# Patient Record
Sex: Female | Born: 1965 | Race: White | Hispanic: No | Marital: Single | Smoking: Never smoker
Health system: Southern US, Community
[De-identification: ages and names within clinical notes are randomized; demographics above are authoritative.]

## PROBLEM LIST (undated history)

## (undated) DIAGNOSIS — I10 Essential (primary) hypertension: Secondary | ICD-10-CM

## (undated) DIAGNOSIS — F419 Anxiety disorder, unspecified: Secondary | ICD-10-CM

## (undated) DIAGNOSIS — E785 Hyperlipidemia, unspecified: Secondary | ICD-10-CM

---

## 2010-05-16 ENCOUNTER — Emergency Department (HOSPITAL_BASED_OUTPATIENT_CLINIC_OR_DEPARTMENT_OTHER)
Admission: EM | Admit: 2010-05-16 | Discharge: 2010-05-16 | Disposition: A | Discharge status: Home / Self Care | Payer: Managed Care, Other (non HMO) | Attending: Emergency Medicine | Admitting: Emergency Medicine

## 2010-05-16 ENCOUNTER — Emergency Department (INDEPENDENT_AMBULATORY_CARE_PROVIDER_SITE_OTHER): Payer: Managed Care, Other (non HMO)

## 2010-05-16 DIAGNOSIS — M7989 Other specified soft tissue disorders: Secondary | ICD-10-CM | POA: Insufficient documentation

## 2010-05-16 DIAGNOSIS — M79609 Pain in unspecified limb: Secondary | ICD-10-CM | POA: Insufficient documentation

## 2012-05-11 IMAGING — US US EXTREM LOW VENOUS*L*
1 series · 14 of 21 positions shown · non-contrast
Comparison: None.

CLINICAL DATA: LOWER EXTREMITY VENOUS DUPLEX ULTRASOUND
TECHNIQUE: Gray-scale sonography with graded compression, as well
as color Doppler and duplex ultrasound were performed to evaluate
the deep venous system of the lower extremity from the level of the
common femoral vein through the popliteal and proximal calf veins.
Spectral Doppler was utilized to evaluate flow at rest and with
distal augmentation maneuvers.

[Series 1: us extrem low venous*left* · 14 of 21 slices shown]
[im 1/21]
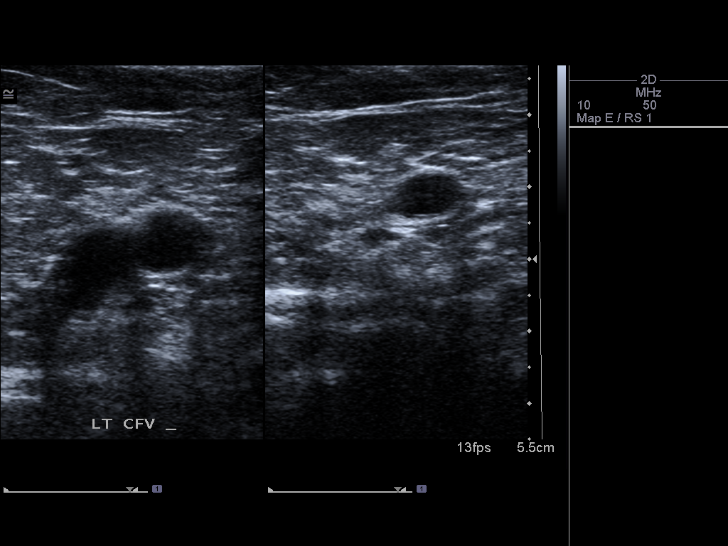
[im 3/21]
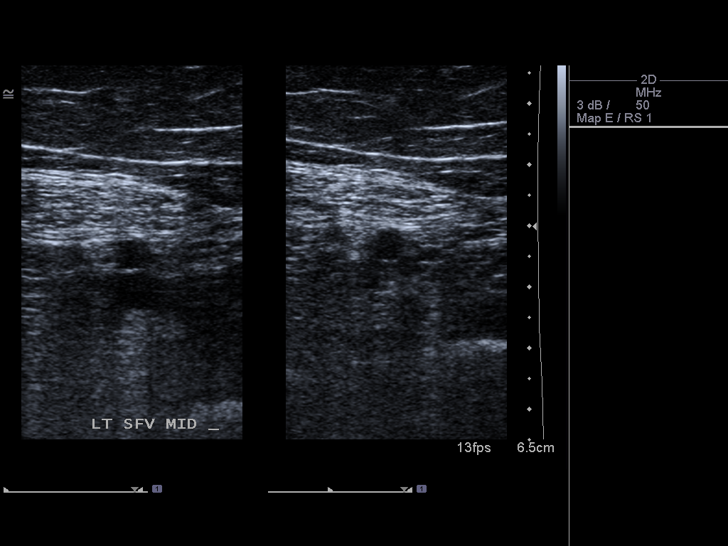
[im 4/21]
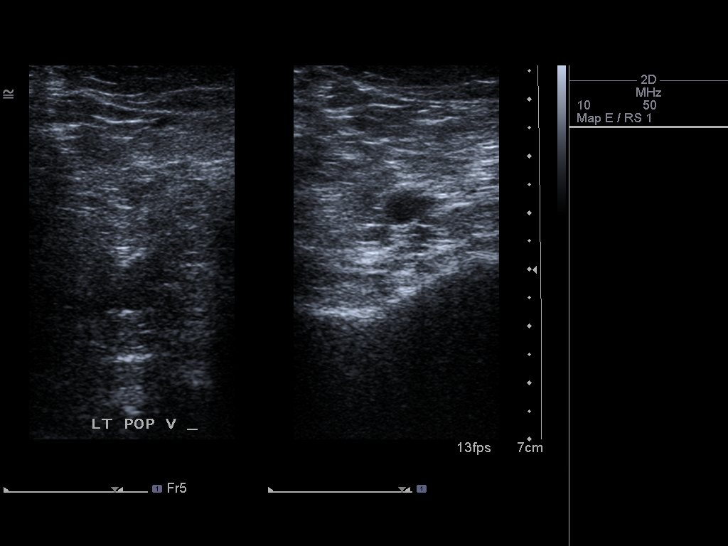
[im 6/21]
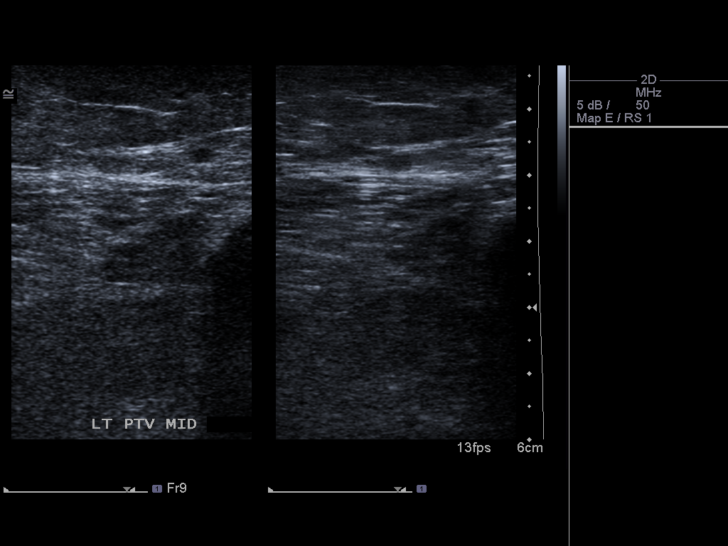
[im 7/21]
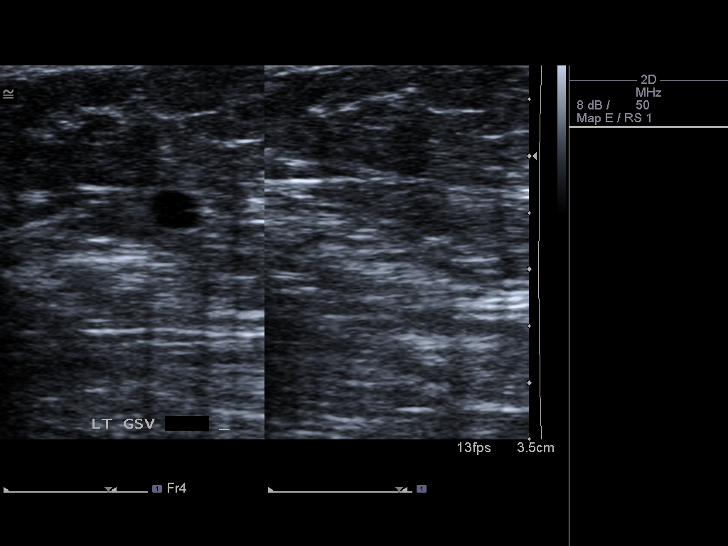
[im 9/21]
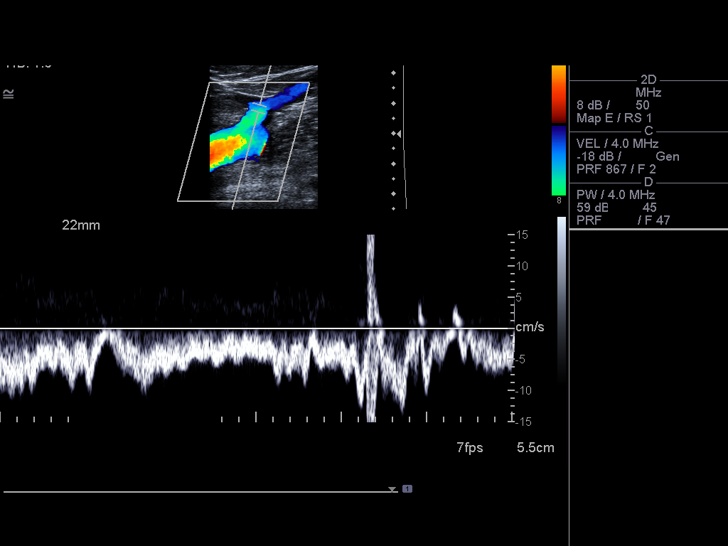
[im 10/21]
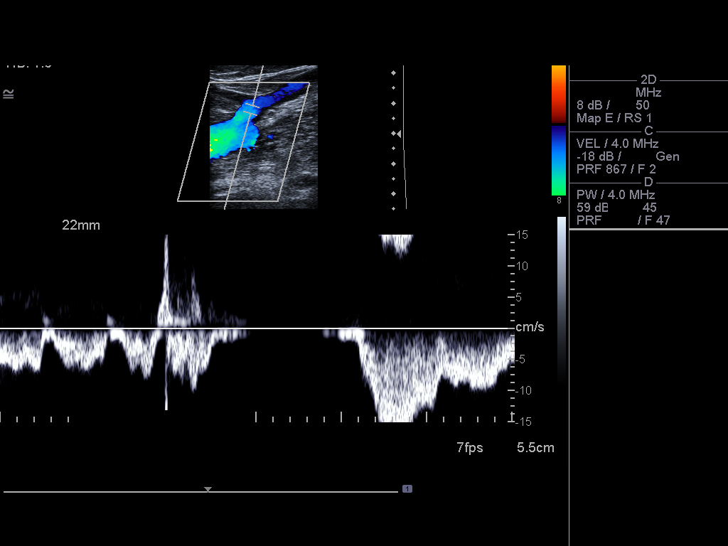
[im 12/21]
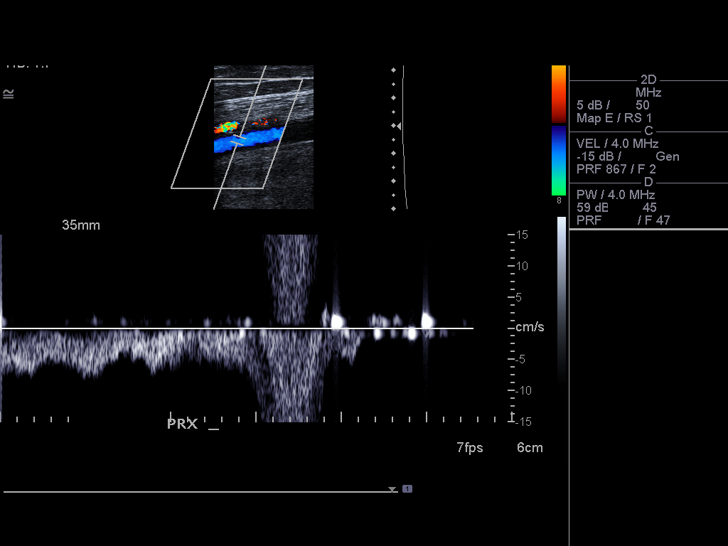
[im 13/21]
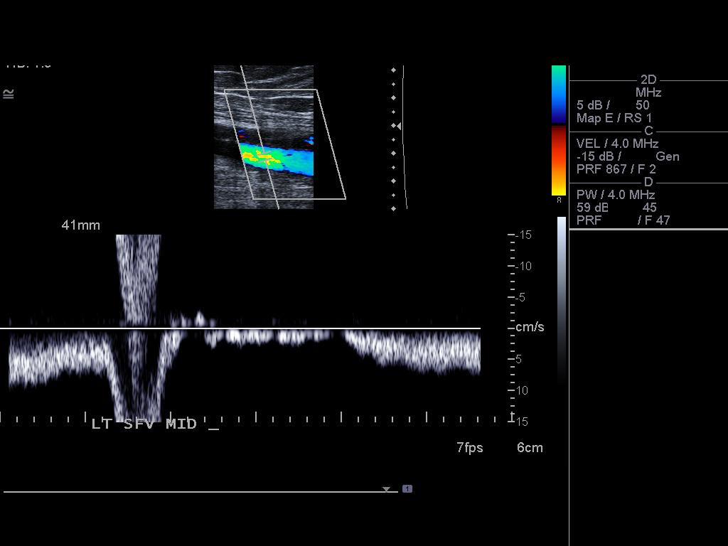
[im 15/21]
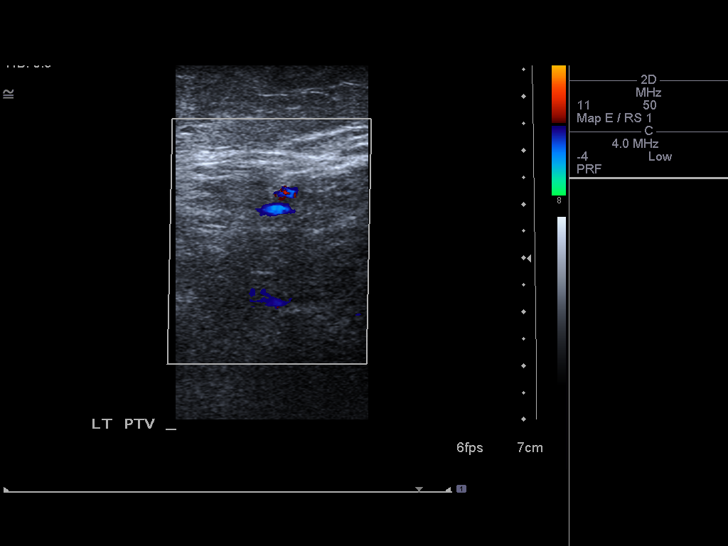
[im 16/21]
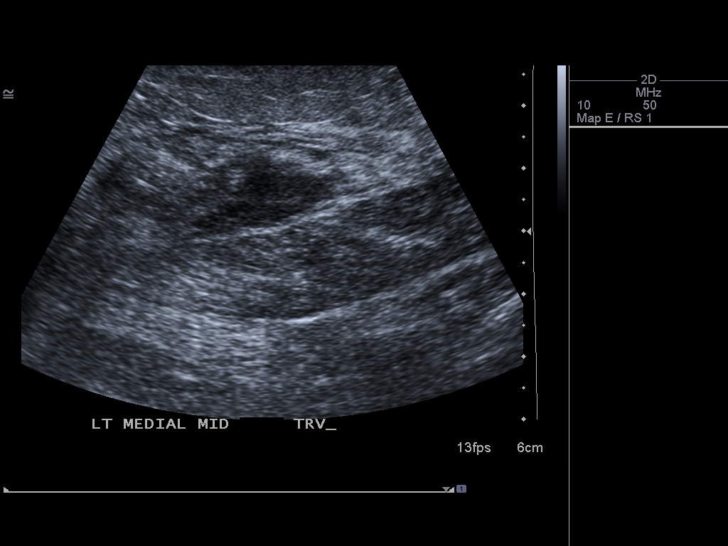
[im 18/21]
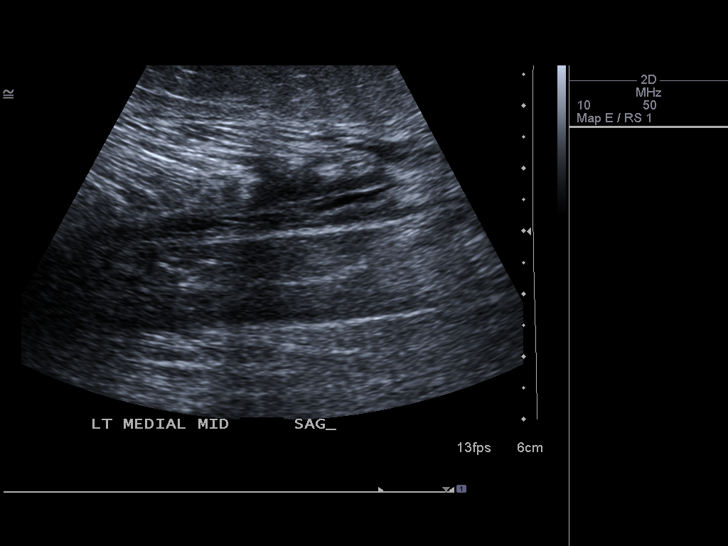
[im 19/21]
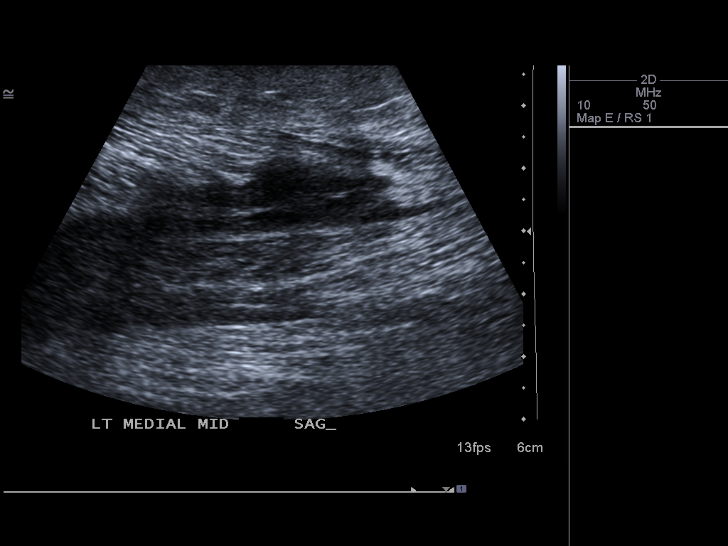
[im 21/21]
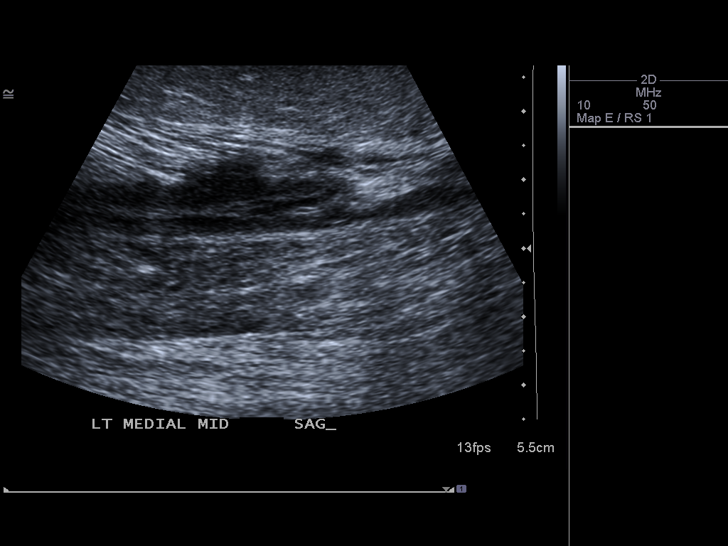

[14 of 21 positions shown; findings below may reference images not displayed]

FINDINGS: Normal compressibility of the common femoral,
superficial femoral, and popliteal veins is demonstrated. Peroneal
vein is not visualized.  No filling defects to suggest DVT on
grayscale or color Doppler imaging.  Doppler waveforms show normal
direction of venous flow, normal respiratory phasicity and response
to augmentation.

In the area of pain, there is a vague ill-defined area of decreased
echogenicity, edemameasuring a maximum of 2.85 cm in length.
IMPRESSION: No evidence of lower extremity deep vein thrombosis.  Vague
hypoechoic region in the mid calf, posteriorly in the area of pain
which is nonspecific and could relate to resolving hematoma or
inflammatory changes.

## 2020-02-16 ENCOUNTER — Emergency Department (HOSPITAL_BASED_OUTPATIENT_CLINIC_OR_DEPARTMENT_OTHER)
Admission: EM | Admit: 2020-02-16 | Discharge: 2020-02-16 | Disposition: A | Discharge status: Home / Self Care | Payer: 59 | Attending: Emergency Medicine | Admitting: Emergency Medicine

## 2020-02-16 ENCOUNTER — Encounter (HOSPITAL_BASED_OUTPATIENT_CLINIC_OR_DEPARTMENT_OTHER): Payer: Self-pay

## 2020-02-16 ENCOUNTER — Other Ambulatory Visit: Payer: Self-pay

## 2020-02-16 DIAGNOSIS — I10 Essential (primary) hypertension: Secondary | ICD-10-CM | POA: Diagnosis present

## 2020-02-16 HISTORY — DX: Hyperlipidemia, unspecified: E78.5

## 2020-02-16 HISTORY — DX: Anxiety disorder, unspecified: F41.9

## 2020-02-16 HISTORY — DX: Essential (primary) hypertension: I10

## 2020-02-16 LAB — CBC WITH DIFFERENTIAL/PLATELET
Abs Immature Granulocytes: 0.02 10*3/uL (ref 0.00–0.07)
Basophils Absolute: 0.1 10*3/uL (ref 0.0–0.1)
Basophils Relative: 1 %
Eosinophils Absolute: 0.2 10*3/uL (ref 0.0–0.5)
Eosinophils Relative: 3 %
HCT: 45.1 % (ref 36.0–46.0)
Hemoglobin: 15.3 g/dL — ABNORMAL HIGH (ref 12.0–15.0)
Immature Granulocytes: 0 %
Lymphocytes Relative: 23 %
Lymphs Abs: 1.6 10*3/uL (ref 0.7–4.0)
MCH: 30.6 pg (ref 26.0–34.0)
MCHC: 33.9 g/dL (ref 30.0–36.0)
MCV: 90.2 fL (ref 80.0–100.0)
Monocytes Absolute: 0.5 10*3/uL (ref 0.1–1.0)
Monocytes Relative: 7 %
Neutro Abs: 4.8 10*3/uL (ref 1.7–7.7)
Neutrophils Relative %: 66 %
Platelets: 297 10*3/uL (ref 150–400)
RBC: 5 MIL/uL (ref 3.87–5.11)
RDW: 12.6 % (ref 11.5–15.5)
WBC: 7.2 10*3/uL (ref 4.0–10.5)
nRBC: 0 % (ref 0.0–0.2)

## 2020-02-16 LAB — BASIC METABOLIC PANEL
Anion gap: 9 (ref 5–15)
BUN: 14 mg/dL (ref 6–20)
CO2: 28 mmol/L (ref 22–32)
Calcium: 9.4 mg/dL (ref 8.9–10.3)
Chloride: 103 mmol/L (ref 98–111)
Creatinine, Ser: 0.92 mg/dL (ref 0.44–1.00)
GFR, Estimated: >60 mL/min (ref >60)
Glucose, Bld: 97 mg/dL (ref 70–99)
Potassium: 3.8 mmol/L (ref 3.5–5.1)
Sodium: 140 mmol/L (ref 135–145)

## 2020-02-16 LAB — TROPONIN I (HIGH SENSITIVITY): Troponin I (High Sensitivity): <2 ng/L (ref <18)

## 2020-02-16 NOTE — ED Provider Notes (Signed)
[facility] EMERGENCY DEPARTMENT Provider Note   CSN: [id number] Arrival date & time: 02/16/20  1235     History Chief Complaint  Patient presents with  . Hypertension    Heather Benton is a 54 y.o. female.  Patient presents chief concern of hypertension.  She states that she has a history of anxiety but has been feeling palpitations every once in a while for the past month.  She has a once every few days lasts 1 or 2 minutes and then resolves.  She thinks she is not sure what initiates it and it only lasts for a few minutes before resolving.  She felt "funny today."  And decided check her blood pressure and found that it was 180 systolic, repeat blood pressure check again showed that it was now 190 systolic she became more more anxious and presents to the ER for evaluation.  She states that she takes blood pressure medication and has not missed a dose.        Past Medical History:  Diagnosis Date  . Anxiety   . Hyperlipidemia   . Hypertension     There are no problems to display for this patient.   History reviewed. No pertinent surgical history.   OB History   No obstetric history on file.     History reviewed. No pertinent family history.  Social History   Tobacco Use  . Smoking status: Never Smoker  Substance Use Topics  . Alcohol use: Never  . Drug use: Never    Home Medications Prior to Admission medications   Medication Sig Start Date End Date Taking? Authorizing Provider  atorvastatin (LIPITOR) 20 MG tablet Take by mouth. 04/21/19  Yes [provider]  losartan (COZAAR) 100 MG tablet Take by mouth. 01/13/19  Yes [provider]  sertraline (ZOLOFT) 100 MG tablet TAKE 1/2 TABLET(50 MG) BY MOUTH DAILY 10/10/18  Yes [provider]    Allergies    Patient has no known allergies.  Review of Systems   Review of Systems  Constitutional: Negative for fever.  HENT: Negative for ear pain.   Eyes: Negative for pain.   Respiratory: Negative for cough.   Cardiovascular: Negative for chest pain.  Gastrointestinal: Negative for abdominal pain.  Genitourinary: Negative for flank pain.  Musculoskeletal: Negative for back pain.  Skin: Negative for rash.  Neurological: Negative for headaches.    Physical Exam Updated Vital Signs BP (!) 143/65 (BP Location: Left Arm)   Pulse 70   Temp 98.4 F (36.9 C) (Oral)   Resp 16   Ht 5' 8.5" (1.74 m)   Wt 81.6 kg   SpO2 99%   BMI 26.97 kg/m   Physical Exam Constitutional:      General: She is not in acute distress.    Appearance: Normal appearance.  HENT:     Head: Normocephalic.     Nose: Nose normal.  Eyes:     Extraocular Movements: Extraocular movements intact.  Cardiovascular:     Rate and Rhythm: Normal rate.  Pulmonary:     Effort: Pulmonary effort is normal.  Musculoskeletal:        General: Normal range of motion.     Cervical back: Normal range of motion.  Neurological:     General: No focal deficit present.     Mental Status: She is alert. Mental status is at baseline.     ED Results / Procedures / Treatments   Labs (all labs ordered are listed, but only  abnormal results are displayed) Labs Reviewed  CBC WITH DIFFERENTIAL/PLATELET - Abnormal; Notable for the following components:      Result Value   Hemoglobin 15.3 (*)    All other components within normal limits  BASIC METABOLIC PANEL  TROPONIN I (HIGH SENSITIVITY)    EKG EKG Interpretation  Date/Time:  Friday February 16 2020 13:07:02 EST Ventricular Rate:  75 PR Interval:  128 QRS Duration: 74 QT Interval:  382 QTC Calculation: 426 R Axis:   63 Text Interpretation: Normal sinus rhythm Nonspecific ST abnormality Abnormal ECG no prior available for comparison Confirmed by Tilden Fossa [phone]) on 02/16/2020 1:19:21 PM   Radiology No results found.  Procedures Procedures (including critical care time)  Medications Ordered in ED Medications - No data to  display  ED Course  I have reviewed the triage vital signs and the nursing notes.  Pertinent labs & imaging results that were available during my care of the patient were reviewed by me and considered in my medical decision making (see chart for details).    MDM Rules/Calculators/A&P                          Labs show white count 7 hemoglobin 15 chemistry unremarkable.  No additional adverse events while in the ER.  Blood pressure appears improved spontaneously.  Recommending patient follow with her primary care doctor within the week for blood pressure management.  Advised return if she has chest pain trouble breathing or any additional concerns.   Final Clinical Impression(s) / ED Diagnoses Final diagnoses:  Hypertension, unspecified type    Rx / DC Orders ED Discharge Orders    None       Cheryll Cockayne, MD 02/16/20 [facility]

## 2020-02-16 NOTE — ED Triage Notes (Addendum)
Pt states felt palpitations a month ago. Hx of anxiety and hypertension. BP 190/101. States she took an extra half a pill of losartan last night (50mg )

## 2020-02-16 NOTE — Discharge Instructions (Addendum)
Call your primary care doctor or specialist as discussed in the next 2-3 days.   Return immediately back to the ER if:  Your symptoms worsen within the next 12-24 hours. You develop new symptoms such as new fevers, persistent vomiting, new pain, shortness of breath, or new weakness or numbness, or if you have any other concerns.
